# Patient Record
Sex: Female | Born: 1975 | Race: White | Hispanic: No | Marital: Married | Smoking: Former smoker
Health system: Southern US, Community
[De-identification: ages and names within clinical notes are randomized; demographics above are authoritative.]

## PROBLEM LIST (undated history)

## (undated) HISTORY — PX: CHOLECYSTECTOMY: SHX55

## (undated) HISTORY — PX: OTHER SURGICAL HISTORY: SHX169

---

## 2010-06-22 ENCOUNTER — Ambulatory Visit (INDEPENDENT_AMBULATORY_CARE_PROVIDER_SITE_OTHER): Payer: Self-pay

## 2010-06-22 ENCOUNTER — Inpatient Hospital Stay (INDEPENDENT_AMBULATORY_CARE_PROVIDER_SITE_OTHER)
Admission: RE | Admit: 2010-06-22 | Discharge: 2010-06-22 | Disposition: A | Discharge status: Home / Self Care | Payer: Self-pay | Source: Ambulatory Visit | Attending: Emergency Medicine | Admitting: Emergency Medicine

## 2010-06-22 DIAGNOSIS — M659 Synovitis and tenosynovitis, unspecified: Secondary | ICD-10-CM

## 2010-09-12 ENCOUNTER — Inpatient Hospital Stay (INDEPENDENT_AMBULATORY_CARE_PROVIDER_SITE_OTHER)
Admission: RE | Admit: 2010-09-12 | Discharge: 2010-09-12 | Disposition: A | Discharge status: Home / Self Care | Payer: Self-pay | Source: Ambulatory Visit | Attending: Family Medicine | Admitting: Family Medicine

## 2010-09-12 DIAGNOSIS — E119 Type 2 diabetes mellitus without complications: Secondary | ICD-10-CM

## 2010-09-12 LAB — POCT URINALYSIS DIP (DEVICE)
Glucose, UA: 500 mg/dL — AB
Leukocytes, UA: NEGATIVE
Protein, ur: NEGATIVE mg/dL
Urobilinogen, UA: 0.2 mg/dL (ref 0.0–1.0)

## 2010-09-12 LAB — POCT I-STAT, CHEM 8
HCT: 49 % — ABNORMAL HIGH (ref 36.0–46.0)
Hemoglobin: 16.7 g/dL — ABNORMAL HIGH (ref 12.0–15.0)
Sodium: 134 mEq/L — ABNORMAL LOW (ref 135–145)
TCO2: 24 mmol/L (ref 0–100)

## 2010-10-10 ENCOUNTER — Inpatient Hospital Stay (INDEPENDENT_AMBULATORY_CARE_PROVIDER_SITE_OTHER)
Admission: RE | Admit: 2010-10-10 | Discharge: 2010-10-10 | Disposition: A | Discharge status: Home / Self Care | Payer: Self-pay | Source: Ambulatory Visit | Attending: Family Medicine | Admitting: Family Medicine

## 2010-10-10 DIAGNOSIS — E119 Type 2 diabetes mellitus without complications: Secondary | ICD-10-CM

## 2010-10-10 LAB — POCT URINALYSIS DIP (DEVICE)
Glucose, UA: NEGATIVE mg/dL
Nitrite: NEGATIVE
Urobilinogen, UA: 0.2 mg/dL (ref 0.0–1.0)

## 2010-10-10 LAB — GLUCOSE, CAPILLARY: Glucose-Capillary: 81 mg/dL (ref 70–99)

## 2010-11-11 ENCOUNTER — Emergency Department (INDEPENDENT_AMBULATORY_CARE_PROVIDER_SITE_OTHER)
Admission: EM | Admit: 2010-11-11 | Discharge: 2010-11-11 | Disposition: A | Discharge status: Home / Self Care | Payer: Self-pay | Source: Home / Self Care | Attending: Emergency Medicine | Admitting: Emergency Medicine

## 2010-11-11 DIAGNOSIS — E119 Type 2 diabetes mellitus without complications: Secondary | ICD-10-CM

## 2010-11-11 DIAGNOSIS — S239XXA Sprain of unspecified parts of thorax, initial encounter: Secondary | ICD-10-CM

## 2010-11-11 DIAGNOSIS — S29012A Strain of muscle and tendon of back wall of thorax, initial encounter: Secondary | ICD-10-CM

## 2010-11-11 LAB — GLUCOSE, CAPILLARY: Glucose-Capillary: 100 mg/dL — ABNORMAL HIGH (ref 70–99)

## 2010-11-11 MED ORDER — IBUPROFEN 800 MG PO TABS: 800.0000 mg | ORAL_TABLET | Freq: Three times a day (TID) | ORAL | Status: AC | PRN

## 2010-11-11 MED ORDER — METFORMIN HCL 500 MG PO TABS: 500.0000 mg | ORAL_TABLET | Freq: Two times a day (BID) | ORAL | Status: AC

## 2010-11-11 MED ORDER — CYCLOBENZAPRINE HCL 10 MG PO TABS: 5.0000 mg | ORAL_TABLET | Freq: Three times a day (TID) | ORAL | Status: AC | PRN

## 2010-11-11 NOTE — ED Notes (Signed)
Pt. States she was dx with Diabetes here at [facility] in September 2012.  Does not have insurance or PCP and pharmacy told pt. To come here.  In addition, pt. States she has upper back pain.  Back pain has been going on for 1 week, 6/10.

## 2010-11-11 NOTE — ED Provider Notes (Signed)
History     CSN: [id number] Arrival date & time: 11/11/2010  3:38 PM   First MD Initiated Contact with Patient 11/11/10 1611      Chief Complaint  Patient presents with  . Blood Sugar Problem    almost out of metformin prescription  . Back Pain    (Consider location/radiation/quality/duration/timing/severity/associated sxs/prior treatment) HPI Comments: Denies injury; woke up with pain one morning; does have young child approx 35 pounds that she lifts frequently  Patient is a 34 y.o. female presenting with back pain. The history is provided by the patient.  Back Pain  The current episode started more than 1 week ago. The problem occurs constantly. The problem has not changed since onset.The pain is associated with no known injury. The pain is present in the thoracic spine. The quality of the pain is described as aching. The pain does not radiate. The pain is at a severity of 6/10. The symptoms are aggravated by twisting and bending. The pain is the same all the time. Pertinent negatives include no chest pain, no fever, no numbness, no paresis, no tingling and no weakness. Treatments tried: tylenol. The treatment provided no relief. Risk factors include obesity.    Past Medical History  Diagnosis Date  . Diabetes mellitus   . Asthma     Past Surgical History  Procedure Date  . Cholecystectomy   . Carpel tunnel     Family History  Problem Relation Age of Onset  . Cancer Other     History  Substance Use Topics  . Smoking status: Not on file  . Smokeless tobacco: Not on file  . Alcohol Use:     OB History    Grav Para Term Preterm Abortions TAB SAB Ect Mult Living   5 5              Review of Systems  Constitutional: Negative for fever and chills.  Respiratory: Negative for chest tightness, shortness of breath and wheezing.   Cardiovascular: Negative for chest pain.  Musculoskeletal: Positive for back pain.  Neurological: Negative for tingling, weakness and  numbness.    Allergies  Review of patient's allergies indicates not on file.  Home Medications   Current Outpatient Rx  Name Route Sig Dispense Refill  . METFORMIN HCL 500 MG PO TABS Oral Take 500 mg by mouth 2 (two) times daily with a meal.        BP 137/88  Pulse 74  Temp(Src) 97.8 F (36.6 C) (Oral)  Resp 18  SpO2 100%  LMP 11/04/2010  Physical Exam  Constitutional: She is oriented to person, place, and time. She appears well-developed and well-nourished. No distress.  Cardiovascular: Normal rate and regular rhythm.   Pulmonary/Chest: Effort normal and breath sounds normal.  Musculoskeletal:       Thoracic back: She exhibits tenderness. She exhibits no bony tenderness and no edema.       Back:  Neurological: She is alert and oriented to person, place, and time. She has normal strength. No sensory deficit.    ED Course  Procedures (including critical care time)   Labs Reviewed  POCT CBG MONITORING   No results found.   No diagnosis found.    MDM  Blood sugar is 100 on cbg today        Cathlyn Parsons, NP 11/11/10 [phone]

## 2012-09-01 IMAGING — CR DG HAND COMPLETE 3+V*R*
3 series · 3 of 3 positions shown · non-contrast
Comparison: None

CLINICAL DATA: Hand pain.  No known injury.

RIGHT HAND - COMPLETE 3+ VIEW

[view not recorded (1 of 3)]
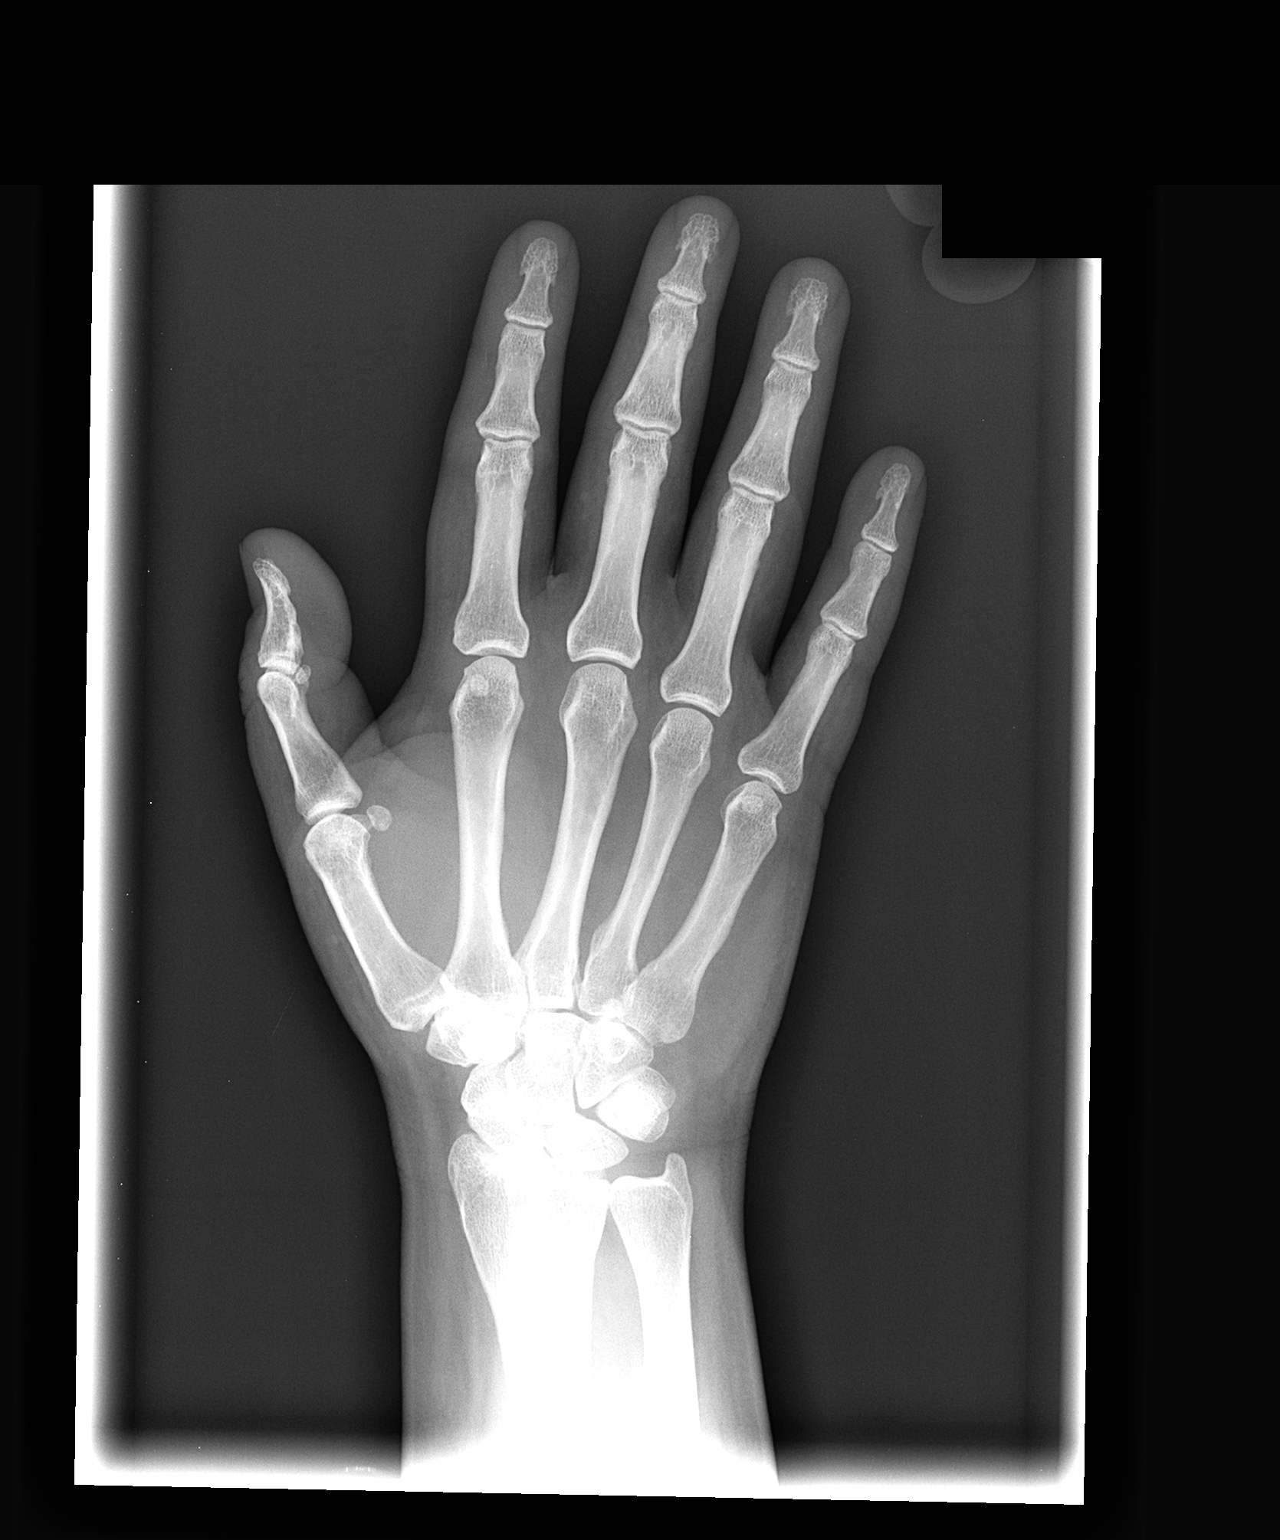

[view not recorded (2 of 3)]
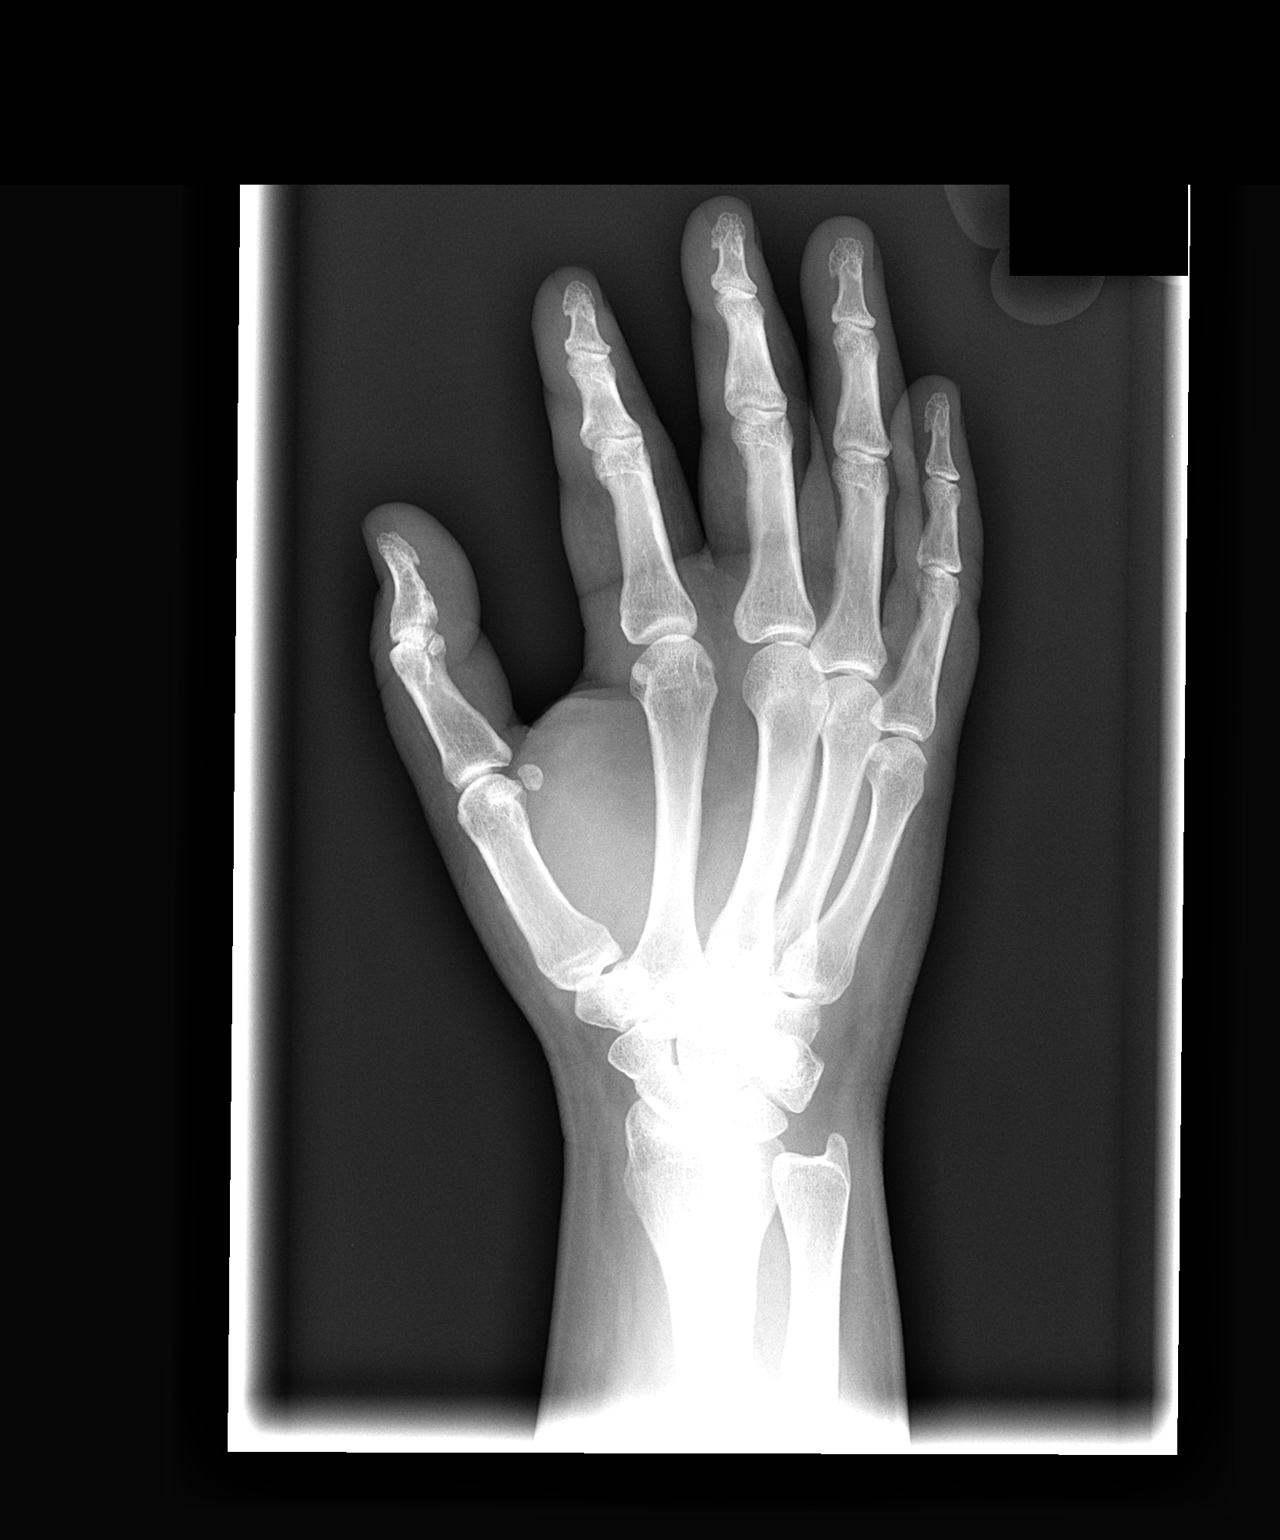

[view not recorded (3 of 3)]
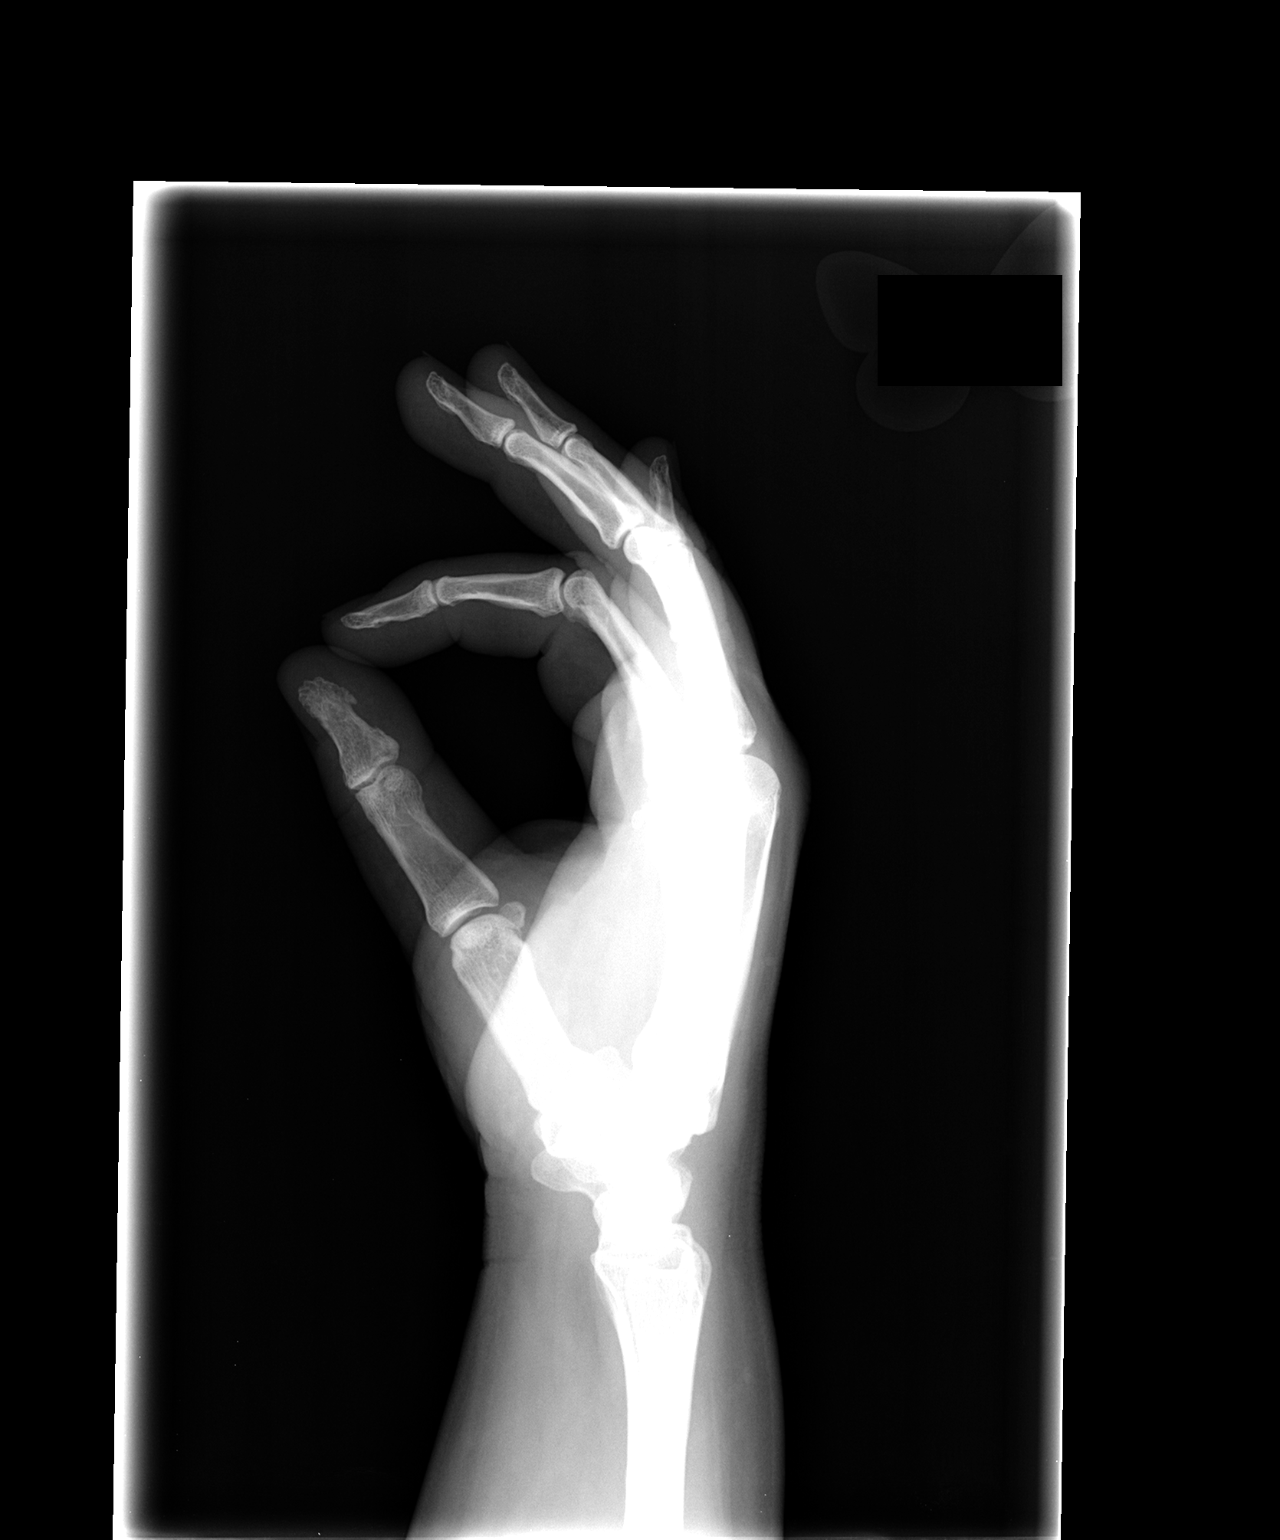

[3 of 3 positions shown; findings below may reference images not displayed]

FINDINGS: The joint spaces are maintained.  No acute bony findings
or degenerative changes.
IMPRESSION: No acute bony findings.

## 2019-08-15 ENCOUNTER — Other Ambulatory Visit: Payer: Self-pay

## 2020-08-06 ENCOUNTER — Emergency Department: Payer: Self-pay

## 2020-08-06 ENCOUNTER — Emergency Department
Admission: EM | Admit: 2020-08-06 | Discharge: 2020-08-06 | Disposition: A | Discharge status: Home / Self Care | Payer: Self-pay | Attending: Emergency Medicine | Admitting: Emergency Medicine

## 2020-08-06 DIAGNOSIS — Z7984 Long term (current) use of oral hypoglycemic drugs: Secondary | ICD-10-CM | POA: Insufficient documentation

## 2020-08-06 DIAGNOSIS — R1110 Vomiting, unspecified: Secondary | ICD-10-CM

## 2020-08-06 DIAGNOSIS — Z87891 Personal history of nicotine dependence: Secondary | ICD-10-CM | POA: Insufficient documentation

## 2020-08-06 DIAGNOSIS — Z20822 Contact with and (suspected) exposure to covid-19: Secondary | ICD-10-CM | POA: Insufficient documentation

## 2020-08-06 DIAGNOSIS — J45909 Unspecified asthma, uncomplicated: Secondary | ICD-10-CM | POA: Insufficient documentation

## 2020-08-06 DIAGNOSIS — K2900 Acute gastritis without bleeding: Secondary | ICD-10-CM | POA: Insufficient documentation

## 2020-08-06 DIAGNOSIS — E119 Type 2 diabetes mellitus without complications: Secondary | ICD-10-CM | POA: Insufficient documentation

## 2020-08-06 LAB — URINALYSIS, ROUTINE W REFLEX MICROSCOPIC
Bacteria, UA: NONE SEEN
Bilirubin Urine: NEGATIVE
Glucose, UA: >=500 mg/dL — AB
Hgb urine dipstick: NEGATIVE
Ketones, ur: NEGATIVE mg/dL
Leukocytes,Ua: NEGATIVE
Nitrite: NEGATIVE
Protein, ur: NEGATIVE mg/dL
Specific Gravity, Urine: 1.03 (ref 1.005–1.030)
pH: 5 (ref 5.0–8.0)

## 2020-08-06 LAB — COMPREHENSIVE METABOLIC PANEL
ALT: 29 U/L (ref 0–44)
AST: 21 U/L (ref 15–41)
Albumin: 4 g/dL (ref 3.5–5.0)
Alkaline Phosphatase: 61 U/L (ref 38–126)
Anion gap: 8 (ref 5–15)
BUN: 19 mg/dL (ref 6–20)
CO2: 25 mmol/L (ref 22–32)
Calcium: 9.2 mg/dL (ref 8.9–10.3)
Chloride: 102 mmol/L (ref 98–111)
Creatinine, Ser: 0.53 mg/dL (ref 0.44–1.00)
GFR, Estimated: >60 mL/min (ref >60)
Glucose, Bld: 291 mg/dL — ABNORMAL HIGH (ref 70–99)
Potassium: 3.9 mmol/L (ref 3.5–5.1)
Sodium: 135 mmol/L (ref 135–145)
Total Bilirubin: 0.7 mg/dL (ref 0.3–1.2)
Total Protein: 7.7 g/dL (ref 6.5–8.1)

## 2020-08-06 LAB — CBC WITH DIFFERENTIAL/PLATELET
Abs Immature Granulocytes: 0.02 10*3/uL (ref 0.00–0.07)
Basophils Absolute: 0 10*3/uL (ref 0.0–0.1)
Basophils Relative: 1 %
Eosinophils Absolute: 0.1 10*3/uL (ref 0.0–0.5)
Eosinophils Relative: 2 %
HCT: 37.7 % (ref 36.0–46.0)
Hemoglobin: 13.5 g/dL (ref 12.0–15.0)
Immature Granulocytes: 0 %
Lymphocytes Relative: 37 %
Lymphs Abs: 2.2 10*3/uL (ref 0.7–4.0)
MCH: 30.5 pg (ref 26.0–34.0)
MCHC: 35.8 g/dL (ref 30.0–36.0)
MCV: 85.3 fL (ref 80.0–100.0)
Monocytes Absolute: 0.5 10*3/uL (ref 0.1–1.0)
Monocytes Relative: 8 %
Neutro Abs: 3.1 10*3/uL (ref 1.7–7.7)
Neutrophils Relative %: 52 %
Platelets: 292 10*3/uL (ref 150–400)
RBC: 4.42 MIL/uL (ref 3.87–5.11)
RDW: 11.7 % (ref 11.5–15.5)
WBC: 6 10*3/uL (ref 4.0–10.5)
nRBC: 0 % (ref 0.0–0.2)

## 2020-08-06 LAB — POC URINE PREG, ED: Preg Test, Ur: NEGATIVE

## 2020-08-06 LAB — RESP PANEL BY RT-PCR (FLU A&B, COVID) ARPGX2
Influenza A by PCR: NEGATIVE
Influenza B by PCR: NEGATIVE
SARS Coronavirus 2 by RT PCR: NEGATIVE

## 2020-08-06 LAB — LIPASE, BLOOD: Lipase: 27 U/L (ref 11–51)

## 2020-08-06 MED ORDER — IOHEXOL 350 MG/ML SOLN
75.0000 mL | Freq: Once | INTRAVENOUS | Status: AC | PRN
  Administered 2020-08-06: 75 mL via INTRAVENOUS
  Filled 2020-08-06: qty 75

## 2020-08-06 MED ORDER — PANTOPRAZOLE SODIUM 20 MG PO TBEC: 20.0000 mg | DELAYED_RELEASE_TABLET | Freq: Every day | ORAL | 0 refills | Status: AC

## 2020-08-06 MED ORDER — SUCRALFATE 1 G PO TABS: 1.0000 g | ORAL_TABLET | Freq: Three times a day (TID) | ORAL | 0 refills | Status: AC

## 2020-08-06 MED ORDER — ONDANSETRON 4 MG PO TBDP: 4.0000 mg | ORAL_TABLET | Freq: Three times a day (TID) | ORAL | 0 refills | Status: AC | PRN

## 2020-08-06 MED ORDER — PANTOPRAZOLE SODIUM 40 MG IV SOLR
40.0000 mg | Freq: Once | INTRAVENOUS | Status: AC
  Administered 2020-08-06: 40 mg via INTRAVENOUS
  Filled 2020-08-06: qty 40

## 2020-08-06 MED ORDER — ONDANSETRON HCL 4 MG/2ML IJ SOLN
4.0000 mg | Freq: Once | INTRAMUSCULAR | Status: AC
Start: 2020-08-06 — End: 2020-08-06
  Administered 2020-08-06: 4 mg via INTRAVENOUS
  Filled 2020-08-06: qty 2

## 2020-08-06 MED ORDER — SODIUM CHLORIDE 0.9 % IV BOLUS
1000.0000 mL | Freq: Once | INTRAVENOUS | Status: AC
  Administered 2020-08-06: 1000 mL via INTRAVENOUS

## 2020-08-06 NOTE — ED Triage Notes (Signed)
Patient presents to ER from home. Patient reports N/V for approx 4 days. Patient reports epigastric pain and RLQ pain. Patient denies taking any medications at home. Patient A&OX3.

## 2020-08-06 NOTE — ED Notes (Signed)
poct pregnancy Negative 

## 2020-08-06 NOTE — Discharge Instructions (Addendum)
  Take the Protonix to help decrease acid, Zofran to help with nausea and Carafate to help line the stomach prior to eating.  He can take Tylenol 1 g every 8 hours to help with pain.  Avoid ibuprofen, NSAIDs, aspirin, smoking, alcohol as these can all irritate the stomach.  Follow-up with your primary care doctor for your elevated blood pressure and elevated sugars  Return to the ER for fevers, worsening pain or any other concerns   IMPRESSION:  1. No acute intra-abdominal or intrapelvic abnormality.  2. Nonobstructive 4 mm left nephrolithiasis.  3. Normal appendix.  4. Bilateral ovaries and adnexa are unremarkable.  5.  Aortic Atherosclerosis (ICD10-I70.0).

## 2020-08-06 NOTE — ED Provider Notes (Signed)
[facility] Emergency Department Provider Note  ____________________________________________   Event Date/Time   First MD Initiated Contact with Patient 08/06/20 1615     (approximate)  I have reviewed the triage vital signs and the nursing notes.   HISTORY  Chief Complaint Emesis and Abdominal Pain    HPI Alyssa Gilmore is a 45 y.o. female with diabetes who comes in with concerns for nausea and vomiting.  Patient reports having prior cholecystectomy.  She states for the past 4 days she has had pain in her epigastric region and feeling a lump there that is constant, severe, nothing makes it better or worse.  States that she is had a lot of nausea and vomiting.  Does report a little bit of diarrhea as well.  Reports a little bit of lower abdominal tenderness as well.  Denies any chest pain, shortness of breath.  Does have her COVID vaccines.  Has not followed around anybody with COVID.  Denies ever having this previously.          Past Medical History:  Diagnosis Date   Asthma    Diabetes mellitus     There are no problems to display for this patient.   Past Surgical History:  Procedure Laterality Date   carpel tunnel     CHOLECYSTECTOMY      Prior to Admission medications   Medication Sig Start Date End Date Taking? Authorizing Provider  metFORMIN (GLUCOPHAGE) 500 MG tablet Take 500 mg by mouth 2 (two) times daily with a meal.      [provider]  metFORMIN (GLUCOPHAGE) 500 MG tablet Take 1 tablet (500 mg total) by mouth 2 (two) times daily with a meal. 11/11/10 11/11/11  Cathlyn Parsons, NP    Allergies Patient has no known allergies.  Family History  Problem Relation Age of Onset   Cancer Other     Social History Social History   Tobacco Use   Smoking status: Former    Types: Cigarettes   Smokeless tobacco: Never  Substance Use Topics   Alcohol use: Not Currently   Drug use: Not Currently      Review of  Systems Constitutional: No fever/chills Eyes: No visual changes. ENT: No sore throat. Cardiovascular: Denies chest pain. Respiratory: Denies shortness of breath. Gastrointestinal: Abdominal pain, nausea, vomiting Genitourinary: Negative for dysuria. Musculoskeletal: Negative for back pain. Skin: Negative for rash. Neurological: Negative for headaches, focal weakness or numbness. All other ROS negative ____________________________________________   PHYSICAL EXAM:  VITAL SIGNS: ED Triage Vitals  Enc Vitals Group     BP 08/06/20 1445 (!) 166/111     Pulse Rate 08/06/20 1445 71     Resp 08/06/20 1445 18     Temp 08/06/20 1445 98.4 F (36.9 C)     Temp Source 08/06/20 1445 Oral     SpO2 08/06/20 1445 98 %     Weight 08/06/20 1446 170 lb (77.1 kg)     Height 08/06/20 1446 5\' 7"  (1.702 m)     Head Circumference --      Peak Flow --      Pain Score 08/06/20 1445 5     Pain Loc --      Pain Edu? --      Excl. in GC? --     Constitutional: Alert and oriented. Well appearing and in no acute distress. Eyes: Conjunctivae are normal. EOMI. Head: Atraumatic. Nose: No congestion/rhinnorhea. Mouth/Throat: Mucous membranes are moist.   Neck: No stridor.  Trachea Midline. FROM Cardiovascular: Normal rate, regular rhythm. Grossly normal heart sounds.  Good peripheral circulation. Respiratory: Normal respiratory effort.  No retractions. Lungs CTAB. Gastrointestinal: Tender throughout her abdomen but most notable epigastrically Musculoskeletal: No lower extremity tenderness nor edema.  No joint effusions. Neurologic:  Normal speech and language. No gross focal neurologic deficits are appreciated.  Skin:  Skin is warm, dry and intact. No rash noted. Psychiatric: Mood and affect are normal. Speech and behavior are normal. GU: Deferred   ____________________________________________   LABS (all labs ordered are listed, but only abnormal results are displayed)  Labs Reviewed   COMPREHENSIVE METABOLIC PANEL - Abnormal; Notable for the following components:      Result Value   Glucose, Bld 291 (*)    All other components within normal limits  LIPASE, BLOOD  CBC WITH DIFFERENTIAL/PLATELET  URINALYSIS, ROUTINE W REFLEX MICROSCOPIC  POC URINE PREG, ED   ____________________________________________   RADIOLOGY   Official radiology report(s): CT ABDOMEN PELVIS W CONTRAST  Result Date: 08/06/2020 CLINICAL DATA:  Patient presents to ER from home. Patient reports N/V for approx 4 days. Patient reports epigastric pain and RLQ pain. Patient denies taking any medications at home EXAM: CT ABDOMEN AND PELVIS WITH CONTRAST TECHNIQUE: Multidetector CT imaging of the abdomen and pelvis was performed using the standard protocol following bolus administration of intravenous contrast. CONTRAST:  16mL OMNIPAQUE IOHEXOL 350 MG/ML SOLN COMPARISON:  None. FINDINGS: Lower chest: No acute abnormality. Hepatobiliary: No focal liver abnormality. Status post cholecystectomy. No biliary dilatation. Pancreas: No focal lesion. Normal pancreatic contour. No surrounding inflammatory changes. No main pancreatic ductal dilatation. Spleen: Normal in size without focal abnormality. Adrenals/Urinary Tract: No adrenal nodule bilaterally. Bilateral kidneys enhance symmetrically. There is a 4 mm calcified stone within the inferior pole of left kidney. No right nephrolithiasis. No hydronephrosis. No definite contour-deforming renal mass. No ureterolithiasis or hydroureter. The urinary bladder is unremarkable. On delayed imaging, there is no urothelial wall thickening and there are no filling defects in the opacified portions of the bilateral collecting systems or ureters. Stomach/Bowel: Stomach is within normal limits. No evidence of bowel wall thickening or dilatation. Appendix appears normal. Vascular/Lymphatic: Phleboliths are noted within the abdomen and pelvis. No abdominal aorta or iliac aneurysm. Mild  atherosclerotic plaque of the aorta and its branches. No abdominal, pelvic, or inguinal lymphadenopathy. Reproductive: Bilateral adnexal suture material likely related to medication. Uterus and bilateral adnexa are unremarkable. The ovaries are unremarkable. Other: No intraperitoneal free fluid. No intraperitoneal free gas. No organized fluid collection. Musculoskeletal: No abdominal wall hernia or abnormality. No suspicious lytic or blastic osseous lesions. No acute displaced fracture. IMPRESSION: 1. No acute intra-abdominal or intrapelvic abnormality. 2. Nonobstructive 4 mm left nephrolithiasis. 3. Normal appendix. 4. Bilateral ovaries and adnexa are unremarkable. 5.  Aortic Atherosclerosis (ICD10-I70.0). Electronically Signed   By: Tish Frederickson M.D.   On: 08/06/2020 19:07    ____________________________________________   PROCEDURES  Procedure(s) performed (including Critical Care):  Procedures   ____________________________________________   INITIAL IMPRESSION / ASSESSMENT AND PLAN / ED COURSE  Alyssa Gilmore was evaluated in Emergency Department on 08/06/2020 for the symptoms described in the history of present illness. She was evaluated in the context of the global COVID-19 pandemic, which necessitated consideration that the patient might be at risk for infection with the SARS-CoV-2 virus that causes COVID-19. Institutional protocols and algorithms that pertain to the evaluation of patients at risk for COVID-19 are in a state of rapid change based on information released by  regulatory bodies including the CDC and federal and state organizations. These policies and algorithms were followed during the patient's care in the ED.    Patient is a well-appearing 45 year old who comes in with concern for abdominal pain, feeling a lump in her epigastric region, lower abdominal pain as well with nausea and vomiting.  Labs ordered evaluate for [employer], AKI.  Will order CT scan to make  sure no obstruction, perforation, appendicitis or other acute pathology.  We will give patient some IV fluids, IV Zofran and reassess.  Labs are reassuring.  Sugars elevated but no evidence of DKA  6:17 PM reevaluated patient feeling better after the medications but still pending CT imaging.  CT imaging is reassuring.  Suspect this could be some gastritis or viral gastroenteritis given some diarrhea.  We will start a PPI, Carafate, Zofran.  Patient been tolerating liquid at bedside.  Discussed with patient that she needs to follow-up with her PCP for her elevated blood pressure and her elevated sugars.  She expressed understanding felt comfortable with being discharged home at this time.  We will give her GI referral  I discussed the provisional nature of ED diagnosis, the treatment so far, the ongoing plan of care, follow up appointments and return precautions with the patient and any family or support people present. They expressed understanding and agreed with the plan, discharged home.          ____________________________________________   FINAL CLINICAL IMPRESSION(S) / ED DIAGNOSES   Final diagnoses:  Acute gastritis without hemorrhage, unspecified gastritis type  Vomiting, intractability of vomiting not specified, presence of nausea not specified, unspecified vomiting type      MEDICATIONS GIVEN DURING THIS VISIT:  Medications  sodium chloride 0.9 % bolus 1,000 mL (1,000 mLs Intravenous New Bag/Given 08/06/20 1647)  ondansetron (ZOFRAN) injection 4 mg (4 mg Intravenous Given 08/06/20 1648)  pantoprazole (PROTONIX) injection 40 mg (40 mg Intravenous Given 08/06/20 1648)  iohexol (OMNIPAQUE) 350 MG/ML injection 75 mL (75 mLs Intravenous Contrast Given 08/06/20 1834)     ED Discharge Orders          Ordered    ondansetron (ZOFRAN ODT) 4 MG disintegrating tablet  Every 8 hours PRN        08/06/20 1915    pantoprazole (PROTONIX) 20 MG tablet  Daily        08/06/20 1915     sucralfate (CARAFATE) 1 g tablet  3 times daily with meals & bedtime        08/06/20 1915             Note:  This document was prepared using Dragon voice recognition software and may include unintentional dictation errors.    Concha Se, MD 08/06/20 [phone]
